# Patient Record
Sex: Male | Born: 1995 | Race: White | Hispanic: No | Marital: Married | Smoking: Current some day smoker
Health system: Southern US, Community
[De-identification: ages and names within clinical notes are randomized; demographics above are authoritative.]

## PROBLEM LIST (undated history)

## (undated) DIAGNOSIS — M459 Ankylosing spondylitis of unspecified sites in spine: Secondary | ICD-10-CM

## (undated) DIAGNOSIS — F909 Attention-deficit hyperactivity disorder, unspecified type: Secondary | ICD-10-CM

## (undated) HISTORY — DX: Ankylosing spondylitis of unspecified sites in spine: M45.9

## (undated) HISTORY — DX: Attention-deficit hyperactivity disorder, unspecified type: F90.9

## (undated) HISTORY — PX: ORIF ELBOW FRACTURE: SHX5031

---
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
## 2020-12-13 ENCOUNTER — Telehealth: Payer: Self-pay | Admitting: Emergency Medicine

## 2020-12-13 ENCOUNTER — Emergency Department (INDEPENDENT_AMBULATORY_CARE_PROVIDER_SITE_OTHER)
Admission: RE | Admit: 2020-12-13 | Discharge: 2020-12-13 | Disposition: A | Discharge status: Home / Self Care | Payer: BC Managed Care – PPO | Source: Ambulatory Visit

## 2020-12-13 ENCOUNTER — Other Ambulatory Visit: Payer: Self-pay

## 2020-12-13 VITALS — BP 155/90 | HR 60 | Temp 97.9°F | Resp 18

## 2020-12-13 DIAGNOSIS — R059 Cough, unspecified: Secondary | ICD-10-CM | POA: Diagnosis not present

## 2020-12-13 DIAGNOSIS — J309 Allergic rhinitis, unspecified: Secondary | ICD-10-CM | POA: Diagnosis not present

## 2020-12-13 MED ORDER — BENZONATATE 200 MG PO CAPS: 200.0000 mg | ORAL_CAPSULE | Freq: Three times a day (TID) | ORAL | 0 refills | Status: DC | PRN

## 2020-12-13 MED ORDER — FEXOFENADINE HCL 180 MG PO TABS: 180.0000 mg | ORAL_TABLET | Freq: Every day | ORAL | 0 refills | Status: DC

## 2020-12-13 MED ORDER — BENZONATATE 200 MG PO CAPS: 200.0000 mg | ORAL_CAPSULE | Freq: Three times a day (TID) | ORAL | 0 refills | Status: AC

## 2020-12-13 NOTE — ED Triage Notes (Signed)
Pt c/o runny nose and cough since yesterday. Denies fever. Albuterol prn. No known covid exposure.

## 2020-12-13 NOTE — ED Provider Notes (Signed)
[facility]    CSN: [id number] Arrival date & time: 12/13/20  1519      History   Chief Complaint Chief Complaint  Patient presents with   Cough    APPT 5pm   Nasal Congestion    HPI Benjamin Bird is a 25 y.o. male.   HPI 25 year old male presents with runny nose and cough for 1 day.  Denies fever.  Reports using albuterol as needed and denies exposure to COVID-19.  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 No pertinent surgical history.     Home Medications    Prior to Admission medications   Medication Sig Start Date End Date Taking? Authorizing Provider  benzonatate (TESSALON) 200 MG capsule Take 1 capsule (200 mg total) by mouth 3 (three) times daily as needed for up to 7 days for cough. 12/13/20 12/20/20 Yes Trevor Iha, FNP  fexofenadine [facility] ALLERGY) 180 MG tablet Take 1 tablet (180 mg total) by mouth daily for 15 days. 12/13/20 12/28/20 Yes Trevor Iha, FNP  albuterol (PROVENTIL HFA;VENTOLIN HFA) 108 (90 BASE) MCG/ACT inhaler Inhale 2 puffs into the lungs every 6 (six) hours as needed for wheezing or shortness of breath.    [provider]  amoxicillin (AMOXIL) 500 MG capsule Take 1 capsule (500 mg total) by mouth 3 (three) times daily. 04/23/12   Palumbo, April, MD  amphetamine-dextroamphetamine (ADDERALL XR) 30 MG 24 hr capsule Take 1 capsule (30 mg total) by mouth daily. 01/25/14   Tamala Julian, PA-C  diclofenac (CATAFLAM) 50 MG tablet Take by mouth.    [provider]  diclofenac (VOLTAREN) 50 MG EC tablet Take 50 mg by mouth 2 (two) times daily.    [provider]  fluticasone (FLOVENT HFA) 44 MCG/ACT inhaler Inhale into the lungs.    [provider]  hydrOXYzine (ATARAX/VISTARIL) 10 MG tablet Take 1 tablet (10 mg total) by mouth 3 (three) times daily as needed (anxiety). 04/13/13   Kendrick Fries, NP  sulfaSALAzine (AZULFIDINE) 500 MG tablet Take 1,000 mg by mouth 2 (two) times daily.    [provider]  sulfaSALAzine (AZULFIDINE) 500 MG tablet  06/16/13   [provider]    Family History Family History  Problem Relation Age of Onset   Bipolar disorder Mother     Social History Social History   Tobacco Use   Smoking status: Some Days    Types: Cigars   Smokeless tobacco: Never  Vaping Use   Vaping Use: Never used  Substance Use Topics   Alcohol use: Yes    Comment: occ   Drug use: No     Allergies   Cephalexin and Zyrtec [cetirizine]   Review of Systems Review of Systems  HENT:  Positive for congestion and postnasal drip.   All other systems reviewed and are negative.   Physical Exam Triage Vital Signs ED Triage Vitals  Enc Vitals Group     BP 12/13/20 1543 (!) 155/90     Pulse Rate 12/13/20 1543 60     Resp 12/13/20 1543 18     Temp 12/13/20 1543 97.9 F (36.6 C)     Temp Source 12/13/20 1543 Oral     SpO2 12/13/20 1543 99 %  Weight --      Height --      Head Circumference --      Peak Flow --      Pain Score 12/13/20 1545 0     Pain Loc --      Pain Edu? --      Excl. in GC? --    No data found.  Updated Vital Signs BP (!) 155/90 (BP Location: Right Arm)   Pulse 60   Temp 97.9 F (36.6 C) (Oral)   Resp 18   SpO2 99%      Physical Exam Vitals and nursing note reviewed.  Constitutional:      General: He is not in acute distress.    Appearance: Normal appearance. He is normal weight. He is not ill-appearing.  HENT:     Head: Normocephalic and atraumatic.     Right Ear: Tympanic membrane, ear canal and external ear normal.     Left Ear: Tympanic membrane, ear canal and external ear normal.     Nose: Nose normal.     Mouth/Throat:     Mouth:  Mucous membranes are moist.     Pharynx: Oropharynx is clear.     Comments: Moderate amount of clear drainage of posterior oropharynx noted Eyes:     Extraocular Movements: Extraocular movements intact.     Conjunctiva/sclera: Conjunctivae normal.     Pupils: Pupils are equal, round, and reactive to light.  Cardiovascular:     Rate and Rhythm: Normal rate and regular rhythm.     Pulses: Normal pulses.     Heart sounds: Normal heart sounds. No murmur heard. Pulmonary:     Effort: Pulmonary effort is normal.     Breath sounds: Normal breath sounds. No wheezing, rhonchi or rales.  Musculoskeletal:        General: Normal range of motion.     Cervical back: Normal range of motion and neck supple.  Skin:    General: Skin is warm and dry.  Neurological:     General: No focal deficit present.     Mental Status: He is alert and oriented to person, place, and time. Mental status is at baseline.  Psychiatric:        Mood and Affect: Mood normal.        Behavior: Behavior normal.        Thought Content: Thought content normal.     UC Treatments / Results  Labs (all labs ordered are listed, but only abnormal results are displayed) Labs Reviewed - No data to display  EKG   Radiology No results found.  Procedures Procedures (including critical care time)  Medications Ordered in UC Medications - No data to display  Initial Impression / Assessment and Plan / UC Course  I have reviewed the triage vital signs and the nursing notes.  Pertinent labs & imaging results that were available during my care of the patient were reviewed by me and considered in my medical decision making (see chart for details).     MDM: 1.  Cough-Rx Tessalon Perles; 2.  Allergic rhinitis-Rx'd Allegra.  Advised patient to take medication as directed with food to completion.  Advised patient to take Allegra daily for next 7 days for concurrent postnasal drip/drainage, then as needed.  Encouraged patient  increase daily water intake while taking these medications. Final Clinical Impressions(s) / UC Diagnoses   Final diagnoses:  Cough, unspecified type  Allergic rhinitis, unspecified seasonality, unspecified trigger     Discharge  Instructions      Advised patient to take medication as directed with food to completion.  Advised patient to take Allegra daily for next 7 days for concurrent postnasal drip/drainage, then as needed.  Encouraged patient increase daily water intake while taking these medications.     ED Prescriptions     Medication Sig Dispense Auth. Provider   benzonatate (TESSALON) 200 MG capsule Take 1 capsule (200 mg total) by mouth 3 (three) times daily as needed for up to 7 days for cough. 30 capsule Trevor Iha, FNP   fexofenadine [facility] ALLERGY) 180 MG tablet Take 1 tablet (180 mg total) by mouth daily for 15 days. 15 tablet Trevor Iha, FNP      PDMP not reviewed this encounter.   Trevor Iha, FNP 12/13/20 1706

## 2020-12-13 NOTE — Telephone Encounter (Signed)
Request to switch pharmacy for discharge meds - done via e-script

## 2020-12-13 NOTE — Discharge Instructions (Addendum)
Advised patient to take medication as directed with food to completion.  Advised patient to take Allegra daily for next 7 days for concurrent postnasal drip/drainage, then as needed.  Encouraged patient increase daily water intake while taking these medications.

## 2021-07-27 ENCOUNTER — Encounter: Payer: Self-pay | Admitting: Emergency Medicine

## 2021-07-27 ENCOUNTER — Emergency Department (INDEPENDENT_AMBULATORY_CARE_PROVIDER_SITE_OTHER)
Admission: EM | Admit: 2021-07-27 | Discharge: 2021-07-27 | Disposition: A | Discharge status: Home / Self Care | Payer: Commercial Managed Care - PPO | Source: Home / Self Care

## 2021-07-27 DIAGNOSIS — S6990XA Unspecified injury of unspecified wrist, hand and finger(s), initial encounter: Secondary | ICD-10-CM | POA: Diagnosis not present

## 2021-07-27 DIAGNOSIS — Z23 Encounter for immunization: Secondary | ICD-10-CM

## 2021-07-27 DIAGNOSIS — Z299 Encounter for prophylactic measures, unspecified: Secondary | ICD-10-CM

## 2021-07-27 MED ORDER — TETANUS-DIPHTH-ACELL PERTUSSIS 5-2.5-18.5 LF-MCG/0.5 IM SUSY
0.5000 mL | PREFILLED_SYRINGE | Freq: Once | INTRAMUSCULAR | Status: AC
  Administered 2021-07-27: 0.5 mL via INTRAMUSCULAR

## 2021-07-27 NOTE — ED Triage Notes (Signed)
Needs tetanus shot fish hook in finger this week, no signs of infection

## 2021-09-18 ENCOUNTER — Ambulatory Visit (INDEPENDENT_AMBULATORY_CARE_PROVIDER_SITE_OTHER): Payer: Commercial Managed Care - PPO | Admitting: Family

## 2021-09-18 ENCOUNTER — Ambulatory Visit (HOSPITAL_BASED_OUTPATIENT_CLINIC_OR_DEPARTMENT_OTHER)
Admission: RE | Admit: 2021-09-18 | Discharge: 2021-09-18 | Disposition: A | Discharge status: Home / Self Care | Payer: Commercial Managed Care - PPO | Source: Ambulatory Visit | Attending: Family | Admitting: Family

## 2021-09-18 ENCOUNTER — Encounter: Payer: Self-pay | Admitting: Family

## 2021-09-18 VITALS — BP 136/78 | HR 68 | Temp 98.0°F | Resp 16 | Ht 73.0 in | Wt 224.0 lb

## 2021-09-18 DIAGNOSIS — M542 Cervicalgia: Secondary | ICD-10-CM

## 2021-09-18 DIAGNOSIS — R599 Enlarged lymph nodes, unspecified: Secondary | ICD-10-CM

## 2021-09-18 DIAGNOSIS — R61 Generalized hyperhidrosis: Secondary | ICD-10-CM | POA: Diagnosis present

## 2021-09-18 DIAGNOSIS — R5383 Other fatigue: Secondary | ICD-10-CM | POA: Diagnosis not present

## 2021-09-18 LAB — COMPREHENSIVE METABOLIC PANEL
ALT: 36 U/L (ref 0–53)
AST: 18 U/L (ref 0–37)
Albumin: 5.1 g/dL (ref 3.5–5.2)
Alkaline Phosphatase: 106 U/L (ref 39–117)
BUN: 15 mg/dL (ref 6–23)
CO2: 29 mEq/L (ref 19–32)
Calcium: 10.2 mg/dL (ref 8.4–10.5)
Chloride: 103 mEq/L (ref 96–112)
Creatinine, Ser: 0.89 mg/dL (ref 0.40–1.50)
GFR: 120.7 mL/min (ref >60.00)
Glucose, Bld: 81 mg/dL (ref 70–99)
Potassium: 4.5 mEq/L (ref 3.5–5.1)
Sodium: 140 mEq/L (ref 135–145)
Total Bilirubin: 0.9 mg/dL (ref 0.2–1.2)
Total Protein: 7.6 g/dL (ref 6.0–8.3)

## 2021-09-18 LAB — CBC WITH DIFFERENTIAL/PLATELET
Basophils Absolute: 0 10*3/uL (ref 0.0–0.1)
Basophils Relative: 0.7 % (ref 0.0–3.0)
Eosinophils Absolute: 0 10*3/uL (ref 0.0–0.7)
Eosinophils Relative: 0.5 % (ref 0.0–5.0)
HCT: 44 % (ref 39.0–52.0)
Hemoglobin: 15.1 g/dL (ref 13.0–17.0)
Lymphocytes Relative: 28.2 % (ref 12.0–46.0)
Lymphs Abs: 1.2 10*3/uL (ref 0.7–4.0)
MCHC: 34.4 g/dL (ref 30.0–36.0)
MCV: 86.8 fl (ref 78.0–100.0)
Monocytes Absolute: 0.3 10*3/uL (ref 0.1–1.0)
Monocytes Relative: 6.9 % (ref 3.0–12.0)
Neutro Abs: 2.8 10*3/uL (ref 1.4–7.7)
Neutrophils Relative %: 63.7 % (ref 43.0–77.0)
Platelets: 242 10*3/uL (ref 150.0–400.0)
RBC: 5.07 Mil/uL (ref 4.22–5.81)
RDW: 13.2 % (ref 11.5–15.5)
WBC: 4.4 10*3/uL (ref 4.0–10.5)

## 2021-09-18 LAB — TSH: TSH: 2.54 u[IU]/mL (ref 0.35–5.50)

## 2021-09-18 NOTE — Progress Notes (Signed)
Benjamin Bird is a 26 y.o. 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   No current facility-administered medications for this visit.    Allergies: Cephalexin and Zyrtec [cetirizine]  Past Medical History:  Diagnosis Date   ADHD (attention deficit hyperactivity disorder)    Ankylosing spondylitis ([place])     No past surgical history on file.  Family History  Problem Relation Age of Onset   Bipolar disorder Mother     Social History   Tobacco Use   Smoking status: Some Days    Types: Cigars   Smokeless tobacco: Never  Substance Use Topics   Alcohol use: Yes    Comment: occ    Subjective:   Presents today as a new patient; has been having some intermittent sore throat/ neck pain "on and off" x 1 month; did have e-visit and took Amoxicillin for 7 days approximately 2 weeks ago which did seem to help but symptoms did not clear completely; feels that neck is "sore to touch." "Feels like symptoms move all around in my neck." Went back to [facility] last weekend- repeat strep test was negative;  No unexplained weight loss; known history of being hot at night and seems slightly worse recently;      Objective:  Vitals:   09/18/21 1125  BP: 136/78  Pulse: 68  Resp: 16  Temp: 98 F (36.7 C)  TempSrc: Oral  SpO2: 98%  Weight: 224 lb (101.6 kg)  Height: 6' 1"  (1.854 m)    General: Well developed, well nourished, in no acute distress  Skin : Warm and dry.  Head: Normocephalic and atraumatic  Eyes: Sclera and conjunctiva clear; pupils round and reactive to light; extraocular movements intact  Ears: External normal; canals clear; tympanic membranes normal  Oropharynx: Pink, supple. No suspicious lesions  Neck: Supple without thyromegaly, adenopathy  Lungs: Respirations  unlabored; clear to auscultation bilaterally without wheeze, rales, rhonchi  CVS exam: normal rate and regular rhythm.  Neurologic: Alert and oriented; speech intact; face symmetrical; moves all extremities well; CNII-XII intact without focal deficit   Assessment:  1. Night sweats   2. Other fatigue   3. Neck pain   4. Swollen lymph nodes     Plan:  Physical exam is reassuring; check CBC, CMP, TSH today; update CXR and neck ultrasound; ? Underlying sinus infection that was not treated completely initially- may consider re-treatment with course of antibiotics vs referral to ENT.   No follow-ups on file.  Orders Placed This Encounter  Procedures   DG Chest 2 View    Standing Status:   Future    Number of Occurrences:   1    Standing Expiration Date:   09/19/2022    Order Specific Question:   Reason for Exam (SYMPTOM  OR DIAGNOSIS REQUIRED)    Answer:   night sweats    Order Specific Question:   Preferred imaging location?    Answer:   [facility]   US Soft Tissue Head/Neck (NON-THYROID)    Standing Status:   Future    Standing Expiration Date:   09/19/2022    Order Specific Question:   Reason for Exam (SYMPTOM  OR DIAGNOSIS REQUIRED)    Answer:   chronic sore throat    Order Specific Question:   Preferred imaging  location?    Answer:   [facility]   CBC with Differential/Platelet   Comp Met (CMET)   TSH    Requested Prescriptions    No prescriptions requested or ordered in this encounter

## 2021-09-19 ENCOUNTER — Other Ambulatory Visit: Payer: Self-pay | Admitting: Family

## 2021-09-19 MED ORDER — DOXYCYCLINE HYCLATE 100 MG PO TABS: 100.0000 mg | ORAL_TABLET | Freq: Two times a day (BID) | ORAL | 0 refills | Status: DC

## 2022-05-21 ENCOUNTER — Ambulatory Visit
Admission: EM | Admit: 2022-05-21 | Discharge: 2022-05-21 | Disposition: A | Discharge status: Home / Self Care | Payer: Commercial Managed Care - PPO | Attending: Internal Medicine | Admitting: Internal Medicine

## 2022-05-21 DIAGNOSIS — S61219A Laceration without foreign body of unspecified finger without damage to nail, initial encounter: Secondary | ICD-10-CM

## 2022-05-21 DIAGNOSIS — S61411A Laceration without foreign body of right hand, initial encounter: Secondary | ICD-10-CM

## 2022-05-21 DIAGNOSIS — S61412A Laceration without foreign body of left hand, initial encounter: Secondary | ICD-10-CM

## 2022-05-21 MED ORDER — DOXYCYCLINE HYCLATE 100 MG PO CAPS: 100.0000 mg | ORAL_CAPSULE | Freq: Two times a day (BID) | ORAL | 0 refills | Status: AC

## 2022-05-21 NOTE — Discharge Instructions (Addendum)
Keep your wound clean and as dry as possible. Do not immerse or soak your wound in water. This means no swimming, washing dishes (unless thick rubber gloves are used), baths, or hot tubs until the stitches are removed. Leave original bandages on for the first 24 hours. After this time, showering or rinsing is recommended, rather than bathing. After the first 24 hours, remove old bandages and gently cleanse your wound with soap and water. Cleansing twice a day prevents buildup of debris and will result in easier suture removal. Return in 12 days for suture removal.    I have sent an antibiotic (Doxycycline) for you to take by mouth. This is often used to treat skin infections. Take as directed. Return in 2 to 3 days if no improvement. It is very important for you to pay attention to any new symptoms or worsening of your current condition. Please go directly to the Emergency Department immediately should you begin to feel worse in any way or have any of the following symptoms: persistent fevers, increased pain, increased swelling, increased redness, redness outside the demarcation or streaking up your arm.

## 2022-05-21 NOTE — ED Provider Notes (Addendum)
[facility]  Note:  This document was prepared using Dragon voice recognition software and may include unintentional dictation errors.  MRN: [id number] DOB: 05-11-1995 DATE: 05/21/22   Subjective:  Chief Complaint:  Chief Complaint  Patient presents with   Laceration     HPI: Benjamin Bird is a 27 y.o. male presenting for laceration to left fourth and fifth digits occurring approximately 1 hour ago.  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  As he was opening the boxes, the blade slipped and cut the distal phalanxes of the left fourth and fifth digits.  He reports using hydrogen peroxide to clean the tips of his fingers.  His last tetanus was in 2023.  He states that the blade was new.  Endorses bleeding. Presents NAD.  Prior to Admission medications   Medication Sig Start Date End Date Taking? Authorizing Provider  doxycycline (VIBRAMYCIN) 100 MG capsule Take 1 capsule (100 mg total) by mouth 2 (two) times daily for 7 days. 05/21/22 05/28/22 Yes Consuella Scurlock P, PA-C     Allergies  Allergen Reactions   Cephalexin Itching   Zyrtec [Cetirizine] Other (See Comments)    History:   Past Medical History:  Diagnosis Date   ADHD (attention deficit hyperactivity disorder)    Ankylosing spondylitis ([place])      Past Surgical History:  Procedure Laterality Date   ORIF ELBOW FRACTURE Left     Family History  Problem Relation Age of Onset   Bipolar disorder Mother     Social History   Tobacco Use   Smoking status: Some Days    Types: Cigars   Smokeless tobacco: Never  Vaping Use   Vaping Use: Never used  Substance Use Topics   Alcohol use: Yes    Comment: occ   Drug use: No    Review of Systems  Constitutional:  Negative for fever.  Gastrointestinal:  Negative for nausea and vomiting.  Skin:  Positive for wound.  Neurological:  Negative for numbness.     Objective:   Vitals: BP 122/82  (BP Location: Right Arm)   Pulse 60   Temp 97.6 F (36.4 C) (Oral)   Resp 12   SpO2 98%   Physical Exam Constitutional:      General: He is not in acute distress.    Appearance: Normal appearance. He is well-developed and overweight. He is not ill-appearing or toxic-appearing.  HENT:     Head: Normocephalic and atraumatic.  Cardiovascular:     Rate and Rhythm: Normal rate and regular rhythm.     Heart sounds: Normal heart sounds.  Pulmonary:     Effort: Pulmonary effort is normal.     Breath sounds: Normal breath sounds.     Comments: Clear to auscultation bilaterally  Abdominal:     General: Bowel sounds are normal.     Palpations: Abdomen is soft.     Tenderness: There is no abdominal tenderness.  Musculoskeletal:     Left hand: Laceration present. Normal pulse.     Comments: Patient has 2 cm laceration to left fourth distal phalanx.  Minimal bleeding at the time of exam.  No foreign bodies noted.  No damage to the nail.  No warmth, erythema, discharge.  Neurovascular intact. TTP.   Patient has 1 cm laceration to left fifth distal phalanx.  Minimal bleeding at the time of exam.  No foreign bodies noted.  No damage to  the nail.  No warmth, erythema, discharge.  Neurovascular intact. TTP.  Skin:    General: Skin is warm and dry.     Findings: Laceration present.  Neurological:     General: No focal deficit present.     Mental Status: He is alert.  Psychiatric:        Mood and Affect: Mood and affect normal.     Results:  Labs: No results found for this or any previous visit (from the past 24 hour(s)).  Radiology: No results found.   UC Course/Treatments:  Procedures: Laceration Repair  Date/Time: 05/21/2022 12:17 PM  Performed by: Carmie End, PA-C Authorized by: Carmie End, PA-C   Consent:    Consent obtained:  Verbal   Consent given by:  Patient   Risks, benefits, and alternatives were discussed: yes     Risks discussed:  Infection and  pain   Alternatives discussed:  No treatment Anesthesia:    Anesthesia method:  Nerve block   Block location:  Fourth and fifht digits   Block needle gauge:  27 G   Block anesthetic:  Lidocaine 1% w/o epi   Block injection procedure:  Anatomic landmarks identified, anatomic landmarks palpated and introduced needle   Block outcome:  Anesthesia achieved Laceration details:    Location:  Finger   Finger location:  L small finger Pre-procedure details:    Preparation:  Patient was prepped and draped in usual sterile fashion Exploration:    Hemostasis achieved with:  Direct pressure   Imaging outcome: foreign body not noted     Wound exploration: wound explored through full range of motion   Treatment:    Amount of cleaning:  Standard Skin repair:    Repair method:  Sutures   Suture size:  4-0   Suture material:  Nylon   Suture technique:  Simple interrupted Approximation:    Approximation:  Close Repair type:    Repair type:  Simple Post-procedure details:    Dressing:  Non-adherent dressing   Procedure completion:  Tolerated well, no immediate complications    Medications Ordered in UC: Medications - No data to display   Assessment and Plan :     ICD-10-CM   1. Laceration of multiple sites of left hand and fingers, initial encounter  S61.412A    S61.219A        Laceration of multiple sites of left hand and fingers, initial encounter Afebrile, nontoxic-appearing, NAD. VSS. DDX includes but not limited to: laceration, infection, tendon injury Lacerations were soaked and cleansed with iodine.  Digital block was performed on left fourth and fifth digits.  3 simple sutures were placed in left fifth digit and 4 sutures were placed in left fourth digit.  Nonadherent dressing was placed.  Patient instructed return in 12 days for suture removal.  He was provided wound care instructions.  He was also prescribed short course of Doxycycline given that he works with his hands  continuously throughout the day and want to cover empirically for infection.  Recommend over-the-counter analgesics as needed for pain.  Strict ED precautions were given and patient verbalized understanding.   ED Discharge Orders          Ordered    doxycycline (VIBRAMYCIN) 100 MG capsule  2 times daily        05/21/22 1133             PDMP not reviewed this encounter.     Catheline Hixon P, PA-C 05/21/22 1223  Kamin Niblack P, PA-C 05/21/22 1224

## 2022-05-21 NOTE — ED Triage Notes (Signed)
Pt presents with laceration to left ring finger that occurred about 1 hr ago. Tetanus last received in 2023.

## 2022-05-22 ENCOUNTER — Telehealth: Payer: Self-pay
